# Patient Record
Sex: Female | Born: 1975 | Hispanic: No | Marital: Married
Health system: Southern US, Community
[De-identification: ages and names within clinical notes are randomized; demographics above are authoritative.]

## PROBLEM LIST (undated history)

## (undated) DIAGNOSIS — C801 Malignant (primary) neoplasm, unspecified: Secondary | ICD-10-CM

## (undated) DIAGNOSIS — E039 Hypothyroidism, unspecified: Secondary | ICD-10-CM

## (undated) HISTORY — PX: THYROIDECTOMY: SHX17

---

## 2020-10-29 ENCOUNTER — Emergency Department (HOSPITAL_COMMUNITY): Payer: HRSA Program

## 2020-10-29 ENCOUNTER — Encounter (HOSPITAL_COMMUNITY): Payer: Self-pay

## 2020-10-29 ENCOUNTER — Other Ambulatory Visit: Payer: Self-pay

## 2020-10-29 ENCOUNTER — Emergency Department (HOSPITAL_COMMUNITY)
Admission: EM | Admit: 2020-10-29 | Discharge: 2020-10-29 | Disposition: A | Discharge status: Home / Self Care | Payer: HRSA Program | Attending: Emergency Medicine | Admitting: Emergency Medicine

## 2020-10-29 DIAGNOSIS — E039 Hypothyroidism, unspecified: Secondary | ICD-10-CM | POA: Insufficient documentation

## 2020-10-29 DIAGNOSIS — I9589 Other hypotension: Secondary | ICD-10-CM

## 2020-10-29 DIAGNOSIS — E861 Hypovolemia: Secondary | ICD-10-CM | POA: Diagnosis not present

## 2020-10-29 DIAGNOSIS — R8271 Bacteriuria: Secondary | ICD-10-CM | POA: Diagnosis not present

## 2020-10-29 DIAGNOSIS — U071 COVID-19: Secondary | ICD-10-CM | POA: Insufficient documentation

## 2020-10-29 DIAGNOSIS — I959 Hypotension, unspecified: Secondary | ICD-10-CM | POA: Diagnosis not present

## 2020-10-29 DIAGNOSIS — Z79899 Other long term (current) drug therapy: Secondary | ICD-10-CM | POA: Diagnosis not present

## 2020-10-29 DIAGNOSIS — R531 Weakness: Secondary | ICD-10-CM | POA: Diagnosis present

## 2020-10-29 LAB — URINALYSIS, ROUTINE W REFLEX MICROSCOPIC
Bilirubin Urine: NEGATIVE
Glucose, UA: NEGATIVE mg/dL
Ketones, ur: NEGATIVE mg/dL
Leukocytes,Ua: NEGATIVE
Nitrite: POSITIVE — AB
Protein, ur: NEGATIVE mg/dL
Specific Gravity, Urine: 1.014 (ref 1.005–1.030)
pH: 5 (ref 5.0–8.0)

## 2020-10-29 LAB — COMPREHENSIVE METABOLIC PANEL
ALT: 18 U/L (ref 0–44)
AST: 20 U/L (ref 15–41)
Albumin: 3.9 g/dL (ref 3.5–5.0)
Alkaline Phosphatase: 54 U/L (ref 38–126)
Anion gap: 8 (ref 5–15)
BUN: 12 mg/dL (ref 6–20)
CO2: 25 mmol/L (ref 22–32)
Calcium: 7.4 mg/dL — ABNORMAL LOW (ref 8.9–10.3)
Chloride: 103 mmol/L (ref 98–111)
Creatinine, Ser: 0.98 mg/dL (ref 0.44–1.00)
GFR, Estimated: >60 mL/min (ref >60)
Glucose, Bld: 106 mg/dL — ABNORMAL HIGH (ref 70–99)
Potassium: 4.6 mmol/L (ref 3.5–5.1)
Sodium: 136 mmol/L (ref 135–145)
Total Bilirubin: 0.3 mg/dL (ref 0.3–1.2)
Total Protein: 6.8 g/dL (ref 6.5–8.1)

## 2020-10-29 LAB — CBC WITH DIFFERENTIAL/PLATELET
Abs Immature Granulocytes: 0.04 10*3/uL (ref 0.00–0.07)
Basophils Absolute: 0 10*3/uL (ref 0.0–0.1)
Basophils Relative: 1 %
Eosinophils Absolute: 0.1 10*3/uL (ref 0.0–0.5)
Eosinophils Relative: 1 %
HCT: 42.7 % (ref 36.0–46.0)
Hemoglobin: 14.2 g/dL (ref 12.0–15.0)
Immature Granulocytes: 1 %
Lymphocytes Relative: 11 %
Lymphs Abs: 1 10*3/uL (ref 0.7–4.0)
MCH: 32.6 pg (ref 26.0–34.0)
MCHC: 33.3 g/dL (ref 30.0–36.0)
MCV: 98.2 fL (ref 80.0–100.0)
Monocytes Absolute: 0.8 10*3/uL (ref 0.1–1.0)
Monocytes Relative: 9 %
Neutro Abs: 6.9 10*3/uL (ref 1.7–7.7)
Neutrophils Relative %: 77 %
Platelets: 213 10*3/uL (ref 150–400)
RBC: 4.35 MIL/uL (ref 3.87–5.11)
RDW: 13.9 % (ref 11.5–15.5)
WBC: 8.8 10*3/uL (ref 4.0–10.5)
nRBC: 0 % (ref 0.0–0.2)

## 2020-10-29 LAB — LACTIC ACID, PLASMA: Lactic Acid, Venous: 0.8 mmol/L (ref 0.5–1.9)

## 2020-10-29 LAB — RESP PANEL BY RT-PCR (FLU A&B, COVID) ARPGX2
Influenza A by PCR: NEGATIVE
Influenza B by PCR: NEGATIVE
SARS Coronavirus 2 by RT PCR: POSITIVE — AB

## 2020-10-29 MED ORDER — SODIUM CHLORIDE 0.9 % IV BOLUS (SEPSIS)
1000.0000 mL | Freq: Once | INTRAVENOUS | Status: AC
  Administered 2020-10-29: 1000 mL via INTRAVENOUS

## 2020-10-29 MED ORDER — SODIUM CHLORIDE 0.9 % IV BOLUS
1000.0000 mL | Freq: Once | INTRAVENOUS | Status: AC
  Administered 2020-10-29: 1000 mL via INTRAVENOUS

## 2020-10-29 MED ORDER — SODIUM CHLORIDE 0.9 % IV SOLN
2.0000 g | Freq: Once | INTRAVENOUS | Status: AC
  Administered 2020-10-29: 2 g via INTRAVENOUS
  Filled 2020-10-29: qty 20

## 2020-10-29 MED ORDER — ONDANSETRON HCL 4 MG/2ML IJ SOLN
4.0000 mg | Freq: Once | INTRAMUSCULAR | Status: AC
  Administered 2020-10-29: 4 mg via INTRAVENOUS
  Filled 2020-10-29: qty 2

## 2020-10-29 MED ORDER — KETOROLAC TROMETHAMINE 30 MG/ML IJ SOLN
30.0000 mg | Freq: Once | INTRAMUSCULAR | Status: AC
  Administered 2020-10-29: 30 mg via INTRAVENOUS
  Filled 2020-10-29: qty 1

## 2020-10-29 MED ORDER — LACTATED RINGERS IV BOLUS
1000.0000 mL | Freq: Once | INTRAVENOUS | Status: AC
  Administered 2020-10-29: 1000 mL via INTRAVENOUS

## 2020-10-29 NOTE — ED Provider Notes (Signed)
[facility] EMERGENCY DEPARTMENT Provider Note   CSN: [id number] Arrival date & time: 10/29/20  0602     History Chief Complaint  Patient presents with  . Muscle Pain    Christina Mccoy is a 44 y.o. female.  Patient states that for 2 days she has been having aches runny nose weakness.  The history is provided by the patient and medical records. No language interpreter was used.  Muscle Pain This is a new problem. The current episode started 2 days ago. The problem occurs constantly. The problem has not changed since onset.Pertinent negatives include no chest pain, no abdominal pain and no headaches. Nothing aggravates the symptoms. She has tried nothing for the symptoms.       History reviewed. No pertinent past medical history.  There are no problems to display for this patient.   History reviewed. No pertinent surgical history.   OB History   No obstetric history on file.     No family history on file.     Home Medications Prior to Admission medications   Medication Sig Start Date End Date Taking? Authorizing Provider  ibuprofen (ADVIL) 200 MG tablet Take 800 mg by mouth every 6 (six) hours as needed for fever or headache.   Yes [provider]  levothyroxine (SYNTHROID) 100 MCG tablet Take 100 mcg by mouth daily before breakfast.   Yes [provider]    Allergies    Patient has no known allergies.  Review of Systems   Review of Systems  Constitutional: Positive for fatigue. Negative for appetite change.  HENT: Negative for congestion, ear discharge and sinus pressure.        Nasal discharge  Eyes: Negative for discharge.  Respiratory: Negative for cough.   Cardiovascular: Negative for chest pain.  Gastrointestinal: Negative for abdominal pain and diarrhea.  Genitourinary: Negative for frequency and hematuria.  Musculoskeletal: Negative for back pain.  Skin: Negative for rash.  Neurological: Negative for seizures and headaches.   Psychiatric/Behavioral: Negative for hallucinations.    Physical Exam Updated Vital Signs BP 99/67   Pulse 71   Temp 97.9 F (36.6 C) (Oral)   Resp 19   Ht 5\' 10"  (1.778 m)   Wt 102.7 kg   SpO2 95%   BMI 32.50 kg/m   Physical Exam Vitals and nursing note reviewed.  Constitutional:      Appearance: She is well-developed.  HENT:     Head: Normocephalic.     Nose: Nose normal.  Eyes:     General: No scleral icterus.    Extraocular Movements: EOM normal.     Conjunctiva/sclera: Conjunctivae normal.  Neck:     Thyroid: No thyromegaly.  Cardiovascular:     Rate and Rhythm: Normal rate and regular rhythm.     Heart sounds: No murmur heard. No friction rub. No gallop.   Pulmonary:     Breath sounds: No stridor. No wheezing or rales.  Chest:     Chest wall: No tenderness.  Abdominal:     General: There is no distension.     Tenderness: There is no abdominal tenderness. There is no rebound.  Musculoskeletal:        General: No edema. Normal range of motion.     Cervical back: Neck supple.  Lymphadenopathy:     Cervical: No cervical adenopathy.  Skin:    Findings: No erythema or rash.  Neurological:     Mental Status: She is alert and oriented to person, place, and time.  Motor: No abnormal muscle tone.     Coordination: Coordination normal.  Psychiatric:        Mood and Affect: Mood and affect normal.        Behavior: Behavior normal.     ED Results / Procedures / Treatments   Labs (all labs ordered are listed, but only abnormal results are displayed) Labs Reviewed  RESP PANEL BY RT-PCR (FLU A&B, COVID) ARPGX2 - Abnormal; Notable for the following components:      Result Value   SARS Coronavirus 2 by RT PCR POSITIVE (*)    All other components within normal limits  COMPREHENSIVE METABOLIC PANEL - Abnormal; Notable for the following components:   Glucose, Bld 106 (*)    Calcium 7.4 (*)    All other components within normal limits  URINALYSIS, ROUTINE W  REFLEX MICROSCOPIC - Abnormal; Notable for the following components:   APPearance HAZY (*)    Hgb urine dipstick LARGE (*)    Nitrite POSITIVE (*)    Bacteria, UA MANY (*)    All other components within normal limits  CULTURE, BLOOD (ROUTINE X 2)  CULTURE, BLOOD (ROUTINE X 2)  URINE CULTURE  CBC WITH DIFFERENTIAL/PLATELET  LACTIC ACID, PLASMA    EKG None  Radiology DG Chest Port 1 View  Result Date: 10/29/2020 CLINICAL DATA:  Shortness of breath with headache, fatigue, runny nose, nausea and myosis. Symptoms 2 days. COVID test pending. EXAM: PORTABLE CHEST 1 VIEW COMPARISON:  None. FINDINGS: Lungs are adequately inflated without focal airspace consolidation or effusion. Calcified granuloma over the right lung base. Cardiomediastinal silhouette and remainder of the exam is unremarkable. IMPRESSION: No active disease. Electronically Signed   By: Marin Olp M.D.   On: 10/29/2020 08:20    Procedures Procedures   Medications Ordered in ED Medications  lactated ringers bolus 1,000 mL (has no administration in time range)  sodium chloride 0.9 % bolus 1,000 mL (0 mLs Intravenous Stopped 10/29/20 0950)  ketorolac (TORADOL) 30 MG/ML injection 30 mg (30 mg Intravenous Given 10/29/20 0823)  ondansetron (ZOFRAN) injection 4 mg (4 mg Intravenous Given 10/29/20 0823)  sodium chloride 0.9 % bolus 1,000 mL (0 mLs Intravenous Stopped 10/29/20 1051)  cefTRIAXone (ROCEPHIN) 2 g in sodium chloride 0.9 % 100 mL IVPB (0 g Intravenous Stopped 10/29/20 1205)  sodium chloride 0.9 % bolus 1,000 mL (0 mLs Intravenous Stopped 10/29/20 1205)    ED Course  I have reviewed the triage vital signs and the nursing notes.  Pertinent labs & imaging results that were available during my care of the patient were reviewed by me and considered in my medical decision making (see chart for details).    MDM Rules/Calculators/A&P                          Patient Covid positive and also has bacteriuria.  Patient has  been hypotensive even after 3 L of saline.  She will be admitted by the hospitalist for further hydration Final Clinical Impression(s) / ED Diagnoses Final diagnoses:  COVID-19  Hypotension due to hypovolemia    Rx / DC Orders ED Discharge Orders    None       Milton Ferguson, MD 10/29/20 1305

## 2020-10-29 NOTE — ED Notes (Signed)
Pt leaving AMA per request

## 2020-10-29 NOTE — ED Triage Notes (Signed)
Pt complains of headache, fatigue, runny nose, nausea, muscle pain pain all over x2 days.

## 2020-10-29 NOTE — ED Notes (Signed)
Date and time results received: 10/29/20 & 1039hrs (use smartphrase ".now" to insert current time)  Test: COVID Critical Value: POSITIVE  Name of Provider Notified: Dr. Roderic Palau  Orders Received? Or Actions Taken?: notified

## 2020-10-29 NOTE — ED Notes (Signed)
Dr. Darlyn Chamber of bp

## 2020-10-29 NOTE — Consult Note (Signed)
Christina Mccoy RXV:[id number] DOB: [birth year] DOA: 10/29/2020   PCP: Patient, No Pcp Per   Patient coming from: Home  Chief Complaint: myalgia, general weakness  HPI:  Christina Mccoy is a 45 y.o. female with medical history of thyroid cancer status post thyroidectomy with resultant hypothyroidism presenting with 2-day history of generalized weakness, fatigue, myalgias, sore throat, and rhinorrhea.  The patient had denied any fevers, chills, chest pain, shortness of breath, headache, vomiting,  abdominal pain, dysuria.  She states that she has had some intermittent loose stools for the past 4 to 5 days, but denies any hematochezia or melena.  She denies any abdominal cramping or pain.  She denies any recent travels or sick contacts.  The patient is a Programme researcher, broadcasting/film/video at [employer]. In the emergency department, the patient was afebrile, but was mildly hypertensive with systolic blood pressure in the 80s.  Patient was given 3 L of normal saline, but her blood pressures continue to remain soft in the low 90s.  As a result, admission was requested for further evaluation and treatment.  Oxygen saturation was 97-99% on room air.  Chest x-ray did not show any infiltrates.  BMP shows sodium 136, potassium 4.6, serum creatinine 0.98.  BUN was 12.  Hepatic panel was unremarkable.  WBC 8.8, hemoglobin 14.2, platelets 213,000.  Urinalysis was negative for pyuria.  Ceftriaxone was given empirically by EDP. I discussed the patient about observation admission to ensure her clinical stability given her hypotension and continued soft blood pressures.  The patient was adamant about leaving [place].  In speaking with Enid Skeens, Jaxon Mynhier has demonstrated the ability to understand his medical condition(s) which include COVID-19 infection and hypotension.  Roxie Kreeger has demonstrated the ability to appreciate how treatment for COVID-19 infection and hypotension  will be beneficial.   Aurielle Slingerland has also  demonstrated the ability to understand and appreciate how refusal of treatement for  COVID-19 infection and hypotension could result in harm, repeat hospitalization, and possibly death.  Wilmina Maxham demonstrates the ability to reason through the risks and benefits of the proposed treatment.  Finally, Denyce Harr is able to clearly communicate his/her choice.     Assessment/Plan: COVID-19 infection -Patient is stable on room air -patient left AMA prior to obtaining inflammatory markers -CXR--no edema or infiltrates  Hypotension -Likely due to volume depletion as the patient has normal lactic acid and is afebrile -Follow blood cultures -Check PCT  Tobacco abuse -Tobacco cessation discussed  Hypothyroidism -Continue Synthroid       History reviewed. No pertinent past medical history. History reviewed. No pertinent surgical history. Social History: 30 pack year hx.  No alcohol or street drugs  Family hx--reviewed.  No pertinent family hx  No Known Allergies   Prior to Admission medications   Medication Sig Start Date End Date Taking? Authorizing Provider  ibuprofen (ADVIL) 200 MG tablet Take 800 mg by mouth every 6 (six) hours as needed for fever or headache.   Yes [provider]  levothyroxine (SYNTHROID) 100 MCG tablet Take 100 mcg by mouth daily before breakfast.   Yes [provider]    Review of Systems:  Constitutional:  No weight loss, night sweats,  Head&Eyes: No headache.  No vision loss.  No eye pain or scotoma ENT:  No Difficulty swallowing,Tooth/dental problems,Sore throat,  No ear ache, post nasal drip,  Cardio-vascular:  No chest pain, Orthopnea, PND, swelling in lower extremities,  dizziness, palpitations  GI:  No  abdominal pain, nausea, vomiting, loss of appetite, hematochezia, melena, heartburn, indigestion, Resp:  No shortness of breath with exertion or at rest. No coughing up of blood .No wheezing.No chest wall deformity   Skin:  no rash or lesions.  GU:  no dysuria, change in color of urine, no urgency or frequency. No flank pain.  Musculoskeletal:  No joint pain or swelling. No decreased range of motion. No back pain.  Psych:  No change in mood or affect. No depression or anxiety. Neurologic: No headache, no dysesthesia, no focal weakness, no vision loss. No syncope  Physical Exam: Vitals:   10/29/20 1130 10/29/20 1200 10/29/20 1215 10/29/20 1300  BP: 102/90 99/67 97/74  92/67  Pulse: 73 71 79 72  Resp: 17 19    Temp:      TempSrc:      SpO2: 95% 95% 99% 97%  Weight:      Height:       General:  A&O x 3, NAD, nontoxic, pleasant/cooperative Head/Eye: No conjunctival hemorrhage, no icterus, [place]/AT, No nystagmus ENT:  No icterus,  No thrush, good dentition, no pharyngeal exudate Neck:  No masses, no lymphadenpathy, no bruits CV:  RRR, no rub, no gallop, no S3 Lung:  Diminished BS, but CTAB, good air movement, no wheeze, no rhonchi Abdomen: soft/NT, +BS, nondistended, no peritoneal signs Ext: No cyanosis, No rashes, No petechiae, No lymphangitis, No edema Neuro: CNII-XII intact, strength 4/5 in bilateral upper and lower extremities, no dysmetria  Labs on Admission:  Basic Metabolic Panel: Recent Labs  Lab 10/29/20 0808  NA 136  K 4.6  CL 103  CO2 25  GLUCOSE 106*  BUN 12  CREATININE 0.98  CALCIUM 7.4*   Liver Function Tests: Recent Labs  Lab 10/29/20 0808  AST 20  ALT 18  ALKPHOS 54  BILITOT 0.3  PROT 6.8  ALBUMIN 3.9   No results for input(s): LIPASE, AMYLASE in the last 168 hours. No results for input(s): AMMONIA in the last 168 hours. CBC: Recent Labs  Lab 10/29/20 0808  WBC 8.8  NEUTROABS 6.9  HGB 14.2  HCT 42.7  MCV 98.2  PLT 213   Coagulation Profile: No results for input(s): INR, PROTIME in the last 168 hours. Cardiac Enzymes: No results for input(s): CKTOTAL, CKMB, CKMBINDEX, TROPONINI in the last 168 hours. BNP: Invalid input(s): POCBNP CBG: No  results for input(s): GLUCAP in the last 168 hours. Urine analysis:    Component Value Date/Time   COLORURINE YELLOW 10/29/2020 0933   APPEARANCEUR HAZY (A) 10/29/2020 0933   LABSPEC 1.014 10/29/2020 0933   PHURINE 5.0 10/29/2020 0933   GLUCOSEU NEGATIVE 10/29/2020 0933   HGBUR LARGE (A) 10/29/2020 0933   BILIRUBINUR NEGATIVE 10/29/2020 0933   KETONESUR NEGATIVE 10/29/2020 0933   PROTEINUR NEGATIVE 10/29/2020 0933   NITRITE POSITIVE (A) 10/29/2020 0933   LEUKOCYTESUR NEGATIVE 10/29/2020 0933   Sepsis Labs: @LABRCNTIP (procalcitonin:4,lacticidven:4) ) Recent Results (from the past 240 hour(s))  Resp Panel by RT-PCR (Flu A&B, Covid) Nasopharyngeal Swab     Status: Abnormal   Collection Time: 10/29/20  8:13 AM   Specimen: Nasopharyngeal Swab; Nasopharyngeal(NP) swabs in vial transport medium  Result Value Ref Range Status   SARS Coronavirus 2 by RT PCR POSITIVE (A) NEGATIVE Final    Comment: RESULT CALLED TO, READ BACK BY AND VERIFIED WITH: LONG L @ 1040 ON [id number] BY HENDERSON L (NOTE) SARS-CoV-2 target nucleic acids are DETECTED.  The SARS-CoV-2 RNA is generally detectable in upper respiratory specimens during  the acute phase of infection. Positive results are indicative of the presence of the identified virus, but do not rule out bacterial infection or co-infection with other pathogens not detected by the test. Clinical correlation with patient history and other diagnostic information is necessary to determine patient infection status. The expected result is Negative.  Fact Sheet for Patients: EntrepreneurPulse.com.au  Fact Sheet for Healthcare Providers: IncredibleEmployment.be  This test is not yet approved or cleared by the [place] FDA and  has been authorized for detection and/or diagnosis of SARS-CoV-2 by FDA under an Emergency Use Authorization (EUA).  This EUA will remain in effect (meaning this test c an be used) for the  duration of  the COVID-19 declaration under Section 564(b)(1) of the Act, 21 U.S.C. section 360bbb-3(b)(1), unless the authorization is terminated or revoked sooner.     Influenza A by PCR NEGATIVE NEGATIVE Final   Influenza B by PCR NEGATIVE NEGATIVE Final    Comment: (NOTE) The Xpert Xpress SARS-CoV-2/FLU/RSV plus assay is intended as an aid in the diagnosis of influenza from Nasopharyngeal swab specimens and should not be used as a sole basis for treatment. Nasal washings and aspirates are unacceptable for Xpert Xpress SARS-CoV-2/FLU/RSV testing.  Fact Sheet for Patients: EntrepreneurPulse.com.au  Fact Sheet for Healthcare Providers: IncredibleEmployment.be  This test is not yet approved or cleared by the [place] FDA and has been authorized for detection and/or diagnosis of SARS-CoV-2 by FDA under an Emergency Use Authorization (EUA). This EUA will remain in effect (meaning this test can be used) for the duration of the COVID-19 declaration under Section 564(b)(1) of the Act, 21 U.S.C. section 360bbb-3(b)(1), unless the authorization is terminated or revoked.  Performed at [facility], [address] [place], [place] [address]   Blood culture (routine x 2)     Status: None (Preliminary result)   Collection Time: 10/29/20 10:35 AM   Specimen: BLOOD RIGHT HAND  Result Value Ref Range Status   Specimen Description   Final    BLOOD RIGHT HAND BOTTLES DRAWN AEROBIC AND ANAEROBIC   Special Requests   Final    Blood Culture adequate volume Performed at [facility], [address], [place], [place] [address]    Culture PENDING  Incomplete   Report Status PENDING  Incomplete  Blood culture (routine x 2)     Status: None (Preliminary result)   Collection Time: 10/29/20 10:35 AM   Specimen: BLOOD RIGHT ARM  Result Value Ref Range Status   Specimen Description   Final    BLOOD RIGHT ARM BOTTLES DRAWN AEROBIC AND ANAEROBIC   Special  Requests   Final    Blood Culture adequate volume Performed at [facility], [address] [place], [place] [address]    Culture PENDING  Incomplete   Report Status PENDING  Incomplete     Radiological Exams on Admission: DG Chest Port 1 View  Result Date: 10/29/2020 CLINICAL DATA:  Shortness of breath with headache, fatigue, runny nose, nausea and myosis. Symptoms 2 days. COVID test pending. EXAM: PORTABLE CHEST 1 VIEW COMPARISON:  None. FINDINGS: Lungs are adequately inflated without focal airspace consolidation or effusion. Calcified granuloma over the right lung base. Cardiomediastinal silhouette and remainder of the exam is unremarkable. IMPRESSION: No active disease. Electronically Signed   By: Marin Olp M.D.   On: 10/29/2020 08:20        Time spent:60 minutes Code Status:   FULL Family Communication:  No Family at bedside Disposition Plan:Leaving AMA Consults called:  none DVT Prophylaxis: Leaving AMA  Orson Eva, DO  [facility] Pager [phone]  If 7PM-7AM, please contact night-coverage [email or web address] Password [email or web address] 10/29/2020, 1:29 PM

## 2020-10-30 ENCOUNTER — Telehealth: Payer: Self-pay | Admitting: Infectious Diseases

## 2020-10-30 NOTE — Telephone Encounter (Signed)
Called to discuss with patient about COVID-19 symptoms and the use of one of the available treatments for those with mild to moderate Covid symptoms and at a high risk of hospitalization.  Pt appears to qualify for outpatient treatment due to co-morbid conditions and/or a member of an at-risk group in accordance with the FDA Emergency Use Authorization.    Symptom onset: 2/25 Vaccinated: not documented  Booster?  Immunocompromised? No evidence in medical history  Qualifiers: BMI  Unable to reach pt - voicemail is full.   Patient was seen in ER yesterday and recommended hospitalization for dehydration. She refused all treatments related to COVID infection at that time as well.    Christina Mccoy

## 2020-11-03 LAB — CULTURE, BLOOD (ROUTINE X 2)
Culture: NO GROWTH
Culture: NO GROWTH
Special Requests: ADEQUATE
Special Requests: ADEQUATE

## 2021-03-14 ENCOUNTER — Emergency Department (HOSPITAL_COMMUNITY): Payer: Self-pay

## 2021-03-14 ENCOUNTER — Encounter (HOSPITAL_COMMUNITY): Payer: Self-pay | Admitting: Emergency Medicine

## 2021-03-14 ENCOUNTER — Other Ambulatory Visit: Payer: Self-pay

## 2021-03-14 ENCOUNTER — Emergency Department (HOSPITAL_COMMUNITY)
Admission: EM | Admit: 2021-03-14 | Discharge: 2021-03-14 | Disposition: A | Discharge status: Home / Self Care | Payer: Self-pay | Attending: Emergency Medicine | Admitting: Emergency Medicine

## 2021-03-14 DIAGNOSIS — R42 Dizziness and giddiness: Secondary | ICD-10-CM | POA: Insufficient documentation

## 2021-03-14 DIAGNOSIS — Z79899 Other long term (current) drug therapy: Secondary | ICD-10-CM | POA: Insufficient documentation

## 2021-03-14 DIAGNOSIS — R059 Cough, unspecified: Secondary | ICD-10-CM | POA: Insufficient documentation

## 2021-03-14 DIAGNOSIS — R5383 Other fatigue: Secondary | ICD-10-CM | POA: Insufficient documentation

## 2021-03-14 DIAGNOSIS — R519 Headache, unspecified: Secondary | ICD-10-CM | POA: Insufficient documentation

## 2021-03-14 DIAGNOSIS — R410 Disorientation, unspecified: Secondary | ICD-10-CM | POA: Insufficient documentation

## 2021-03-14 DIAGNOSIS — R197 Diarrhea, unspecified: Secondary | ICD-10-CM | POA: Insufficient documentation

## 2021-03-14 DIAGNOSIS — Z8616 Personal history of COVID-19: Secondary | ICD-10-CM | POA: Insufficient documentation

## 2021-03-14 DIAGNOSIS — Z85850 Personal history of malignant neoplasm of thyroid: Secondary | ICD-10-CM | POA: Insufficient documentation

## 2021-03-14 DIAGNOSIS — R220 Localized swelling, mass and lump, head: Secondary | ICD-10-CM | POA: Insufficient documentation

## 2021-03-14 DIAGNOSIS — R0602 Shortness of breath: Secondary | ICD-10-CM | POA: Insufficient documentation

## 2021-03-14 DIAGNOSIS — E039 Hypothyroidism, unspecified: Secondary | ICD-10-CM | POA: Insufficient documentation

## 2021-03-14 HISTORY — DX: Hypothyroidism, unspecified: E03.9

## 2021-03-14 HISTORY — DX: Hypocalcemia: E83.51

## 2021-03-14 HISTORY — DX: Malignant (primary) neoplasm, unspecified: C80.1

## 2021-03-14 LAB — COMPREHENSIVE METABOLIC PANEL
ALT: 18 U/L (ref 0–44)
AST: 19 U/L (ref 15–41)
Albumin: 4.6 g/dL (ref 3.5–5.0)
Alkaline Phosphatase: 59 U/L (ref 38–126)
Anion gap: 9 (ref 5–15)
BUN: 14 mg/dL (ref 6–20)
CO2: 24 mmol/L (ref 22–32)
Calcium: 8.3 mg/dL — ABNORMAL LOW (ref 8.9–10.3)
Chloride: 103 mmol/L (ref 98–111)
Creatinine, Ser: 0.99 mg/dL (ref 0.44–1.00)
GFR, Estimated: >60 mL/min (ref >60)
Glucose, Bld: 95 mg/dL (ref 70–99)
Potassium: 3.8 mmol/L (ref 3.5–5.1)
Sodium: 136 mmol/L (ref 135–145)
Total Bilirubin: 1 mg/dL (ref 0.3–1.2)
Total Protein: 7.4 g/dL (ref 6.5–8.1)

## 2021-03-14 LAB — URINALYSIS, ROUTINE W REFLEX MICROSCOPIC
Bacteria, UA: NONE SEEN
Bilirubin Urine: NEGATIVE
Glucose, UA: NEGATIVE mg/dL
Ketones, ur: 5 mg/dL — AB
Nitrite: NEGATIVE
Protein, ur: NEGATIVE mg/dL
Specific Gravity, Urine: 1.006 (ref 1.005–1.030)
pH: 7 (ref 5.0–8.0)

## 2021-03-14 LAB — CBC WITH DIFFERENTIAL/PLATELET
Abs Immature Granulocytes: 0.08 10*3/uL — ABNORMAL HIGH (ref 0.00–0.07)
Basophils Absolute: 0.1 10*3/uL (ref 0.0–0.1)
Basophils Relative: 1 %
Eosinophils Absolute: 0.2 10*3/uL (ref 0.0–0.5)
Eosinophils Relative: 1 %
HCT: 44.1 % (ref 36.0–46.0)
Hemoglobin: 15.1 g/dL — ABNORMAL HIGH (ref 12.0–15.0)
Immature Granulocytes: 1 %
Lymphocytes Relative: 22 %
Lymphs Abs: 3.4 10*3/uL (ref 0.7–4.0)
MCH: 33 pg (ref 26.0–34.0)
MCHC: 34.2 g/dL (ref 30.0–36.0)
MCV: 96.3 fL (ref 80.0–100.0)
Monocytes Absolute: 0.6 10*3/uL (ref 0.1–1.0)
Monocytes Relative: 4 %
Neutro Abs: 11 10*3/uL — ABNORMAL HIGH (ref 1.7–7.7)
Neutrophils Relative %: 71 %
Platelets: 285 10*3/uL (ref 150–400)
RBC: 4.58 MIL/uL (ref 3.87–5.11)
RDW: 14.6 % (ref 11.5–15.5)
WBC: 15.4 10*3/uL — ABNORMAL HIGH (ref 4.0–10.5)
nRBC: 0 % (ref 0.0–0.2)

## 2021-03-14 LAB — MAGNESIUM: Magnesium: 2.2 mg/dL (ref 1.7–2.4)

## 2021-03-14 LAB — TROPONIN I (HIGH SENSITIVITY): Troponin I (High Sensitivity): <2 ng/L (ref <18)

## 2021-03-14 LAB — PREGNANCY, URINE: Preg Test, Ur: NEGATIVE

## 2021-03-14 MED ORDER — SODIUM CHLORIDE 0.9 % IV BOLUS
1000.0000 mL | Freq: Once | INTRAVENOUS | Status: AC
  Administered 2021-03-14: 1000 mL via INTRAVENOUS

## 2021-03-14 NOTE — ED Triage Notes (Signed)
Pt c/o dizziness, fatigue, and diarrhea for the past week. Pt states she has been out of her medications for hypothyroid and hypocalcemia for a while but finally got them filled 2 days ago.

## 2021-03-14 NOTE — ED Notes (Signed)
Patient to CT at this time

## 2021-03-14 NOTE — ED Provider Notes (Signed)
[facility] EMERGENCY DEPARTMENT Provider Note   CSN: [id number] Arrival date & time: 03/14/21  1159     History Chief Complaint  Patient presents with   Dizziness    Christina Mccoy is a 45 y.o. female.  HPI 45 year old female presents with multiple complaints.  For the past week or 2 she has been having fatigue and dizziness.  She has been out of her hypothyroid meds and has not been taking her calcium supplements for about a month but just got them refilled a few days ago.  Over the last few days she is also been getting short of breath on exertion and at rest.  Some mild cough.  She feels like her face is a little swollen.  No vomiting but did have a little bit of diarrhea over the last couple days.  For the last 2-3 days she is also been feeling confused.  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  Mild headache.  Past Medical History:  Diagnosis Date   Cancer ([place])    thyroid   Hypocalcemia    Hypothyroid     Patient Active Problem List   Diagnosis Date Noted   COVID-19    Hypotension due to hypovolemia     Past Surgical History:  Procedure Laterality Date   THYROIDECTOMY       OB History   No obstetric history on file.     No family history on file.     Home Medications Prior to Admission medications   Medication Sig Start Date End Date Taking? Authorizing Provider  ibuprofen (ADVIL) 200 MG tablet Take 800 mg by mouth every 6 (six) hours as needed for fever or headache.    [provider]  levothyroxine (SYNTHROID) 100 MCG tablet Take 100 mcg by mouth daily before breakfast.    [provider]    Allergies    Patient has no known allergies.  Review of Systems   Review of Systems  Constitutional:  Positive for fatigue. Negative for fever.  HENT:  Positive for facial swelling.   Respiratory:  Positive for shortness of breath.    Cardiovascular:  Negative for chest pain.  Gastrointestinal:  Positive for diarrhea. Negative for abdominal pain and vomiting.  Neurological:  Positive for dizziness and headaches. Negative for weakness and numbness.  Psychiatric/Behavioral:  Positive for confusion.   All other systems reviewed and are negative.  Physical Exam Updated Vital Signs BP 136/86   Pulse 75   Temp 98.2 F (36.8 C) (Oral)   Resp (!) 26   Ht 5\' 10"  (1.778 m)   Wt 99.8 kg   SpO2 96%   BMI 31.57 kg/m   Physical Exam Vitals and nursing note reviewed.  Constitutional:      General: She is not in acute distress.    Appearance: She is well-developed. She is not ill-appearing or diaphoretic.  HENT:     Head: Normocephalic and atraumatic.     Right Ear: External ear normal.     Left Ear: External ear normal.     Nose: Nose normal.  Eyes:     General:        Right eye: No discharge.        Left eye: No discharge.     Extraocular Movements: Extraocular movements intact.     Pupils: Pupils are equal, round, and reactive to light.  Cardiovascular:     Rate and Rhythm: Normal rate and regular rhythm.     Heart sounds: Normal heart sounds.  Pulmonary:     Effort: Pulmonary effort is normal.     Breath sounds: Normal breath sounds.  Abdominal:     Palpations: Abdomen is soft.     Tenderness: There is no abdominal tenderness.  Skin:    General: Skin is warm and dry.  Neurological:     Mental Status: She is alert and oriented to person, place, and time.     Comments: CN 3-12 grossly intact. 5/5 strength in all 4 extremities. Grossly normal sensation. Normal finger to nose.   Psychiatric:        Mood and Affect: Mood is not anxious.    ED Results / Procedures / Treatments   Labs (all labs ordered are listed, but only abnormal results are displayed) Labs Reviewed  COMPREHENSIVE METABOLIC PANEL - Abnormal; Notable for the following components:      Result Value   Calcium 8.3 (*)    All other  components within normal limits  CBC WITH DIFFERENTIAL/PLATELET - Abnormal; Notable for the following components:   WBC 15.4 (*)    Hemoglobin 15.1 (*)    Neutro Abs 11.0 (*)    Abs Immature Granulocytes 0.08 (*)    All other components within normal limits  URINALYSIS, ROUTINE W REFLEX MICROSCOPIC - Abnormal; Notable for the following components:   Hgb urine dipstick MODERATE (*)    Ketones, ur 5 (*)    Leukocytes,Ua SMALL (*)    All other components within normal limits  PREGNANCY, URINE  MAGNESIUM  TROPONIN I (HIGH SENSITIVITY)    EKG EKG Interpretation  Date/Time:  Tuesday March 14 2021 12:29:31 EDT Ventricular Rate:  81 PR Interval:  156 QRS Duration: 105 QT Interval:  391 QTC Calculation: 454 R Axis:   66 Text Interpretation: Sinus rhythm No acute changes No old tracing to compare Confirmed by Sherwood Gambler [phone]) on 03/14/2021 12:49:11 PM  Radiology DG Chest 2 View  Result Date: 03/14/2021 CLINICAL DATA:  Dizziness, fatigue EXAM: CHEST - 2 VIEW COMPARISON:  10/29/2020 FINDINGS: Calcified granuloma at the right lung base. Lungs otherwise clear. Heart is normal size. No effusions or acute bony abnormality. IMPRESSION: No active cardiopulmonary disease. Electronically Signed   By: Rolm Baptise M.D.   On: 03/14/2021 14:38   CT Head Wo Contrast  Result Date: 03/14/2021 CLINICAL DATA:  Mental status changes EXAM: CT HEAD WITHOUT CONTRAST TECHNIQUE: Contiguous axial images were obtained from the base of the skull through the vertex without intravenous contrast. COMPARISON:  None. FINDINGS: Brain: No acute intracranial abnormality. Specifically, no hemorrhage, hydrocephalus, mass lesion, acute infarction, or significant intracranial injury. Vascular: No hyperdense vessel or unexpected calcification. Skull: No acute calvarial abnormality. Sinuses/Orbits: No acute findings Other: None IMPRESSION: Normal study. Electronically Signed   By: Rolm Baptise M.D.   On: 03/14/2021 14:37     Procedures Procedures   Medications Ordered in ED Medications  sodium chloride 0.9 % bolus 1,000 mL (0 mLs Intravenous Stopped 03/14/21 1433)    ED Course  I have reviewed the triage vital signs and the nursing notes.  Pertinent labs & imaging results that were available during my care of the patient were reviewed by me and considered in my medical decision making (see chart for details).    MDM Rules/Calculators/A&P  No objective findings on work-up or exam.  Patient endorses some periorbital swelling first thing in the morning on further talking to her about facial swelling but no other swelling.  No positional edema.  My suspicion of an SVC syndrome is pretty low.  Lungs are clear and chest x-ray is normal.  No increased work of breathing on my exam and her heart rate has been normal the whole time with no hypoxia.  Low suspicion for PE and she is PERC negative.  As far as her "confusion" she is not altered here with a benign neuro exam.  She can get an outpatient neuro follow-up but no indication of needing emergent MRI or other acute treatment/investigation.  Will discharge with return precautions.  She has a mildly low calcium but can continue calcium supplements at home. Final Clinical Impression(s) / ED Diagnoses Final diagnoses:  Dizziness    Rx / DC Orders ED Discharge Orders     None        Sherwood Gambler, MD 03/14/21 1544

## 2022-12-27 IMAGING — CT CT HEAD W/O CM
3 series · 16 of 47 positions shown, 19 images · non-contrast
Comparison: None.

CLINICAL DATA: Mental status changes

EXAM:
CT HEAD WITHOUT CONTRAST
TECHNIQUE: Contiguous axial images were obtained from the base of the skull
through the vertex without intravenous contrast.

[Series 2: head w o · axial · 0.41mm/px · z∈[-37,+103]mm · 10 of 34 slices shown, 13 images]
[im 3/34  brain]
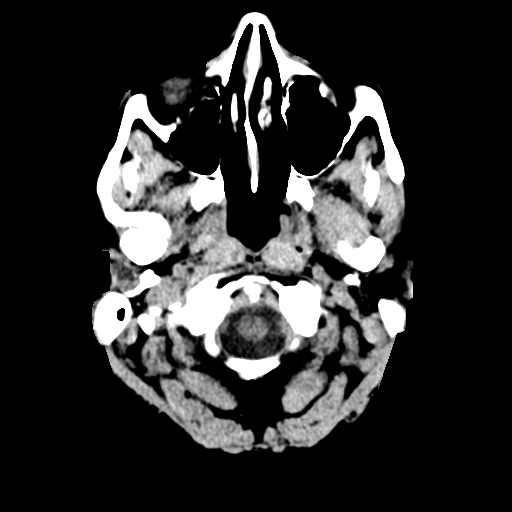
[im 3/34  bone]
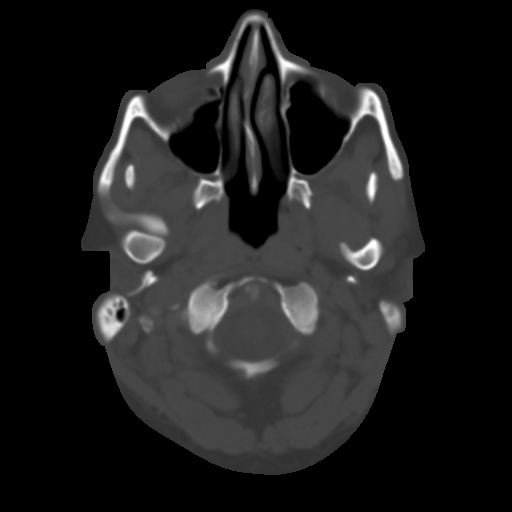
[im 6/34  brain]
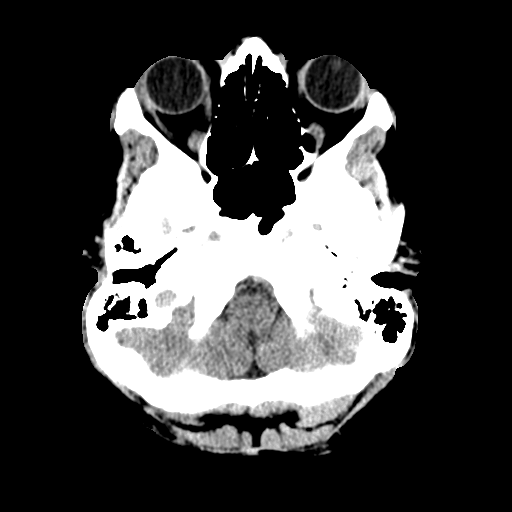
[im 10/34  brain]
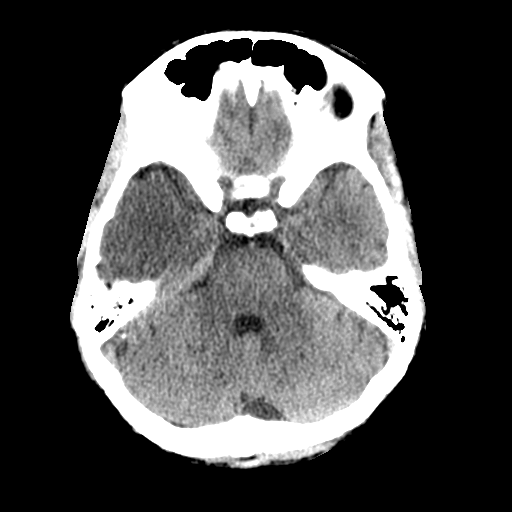
[im 12/34  brain]
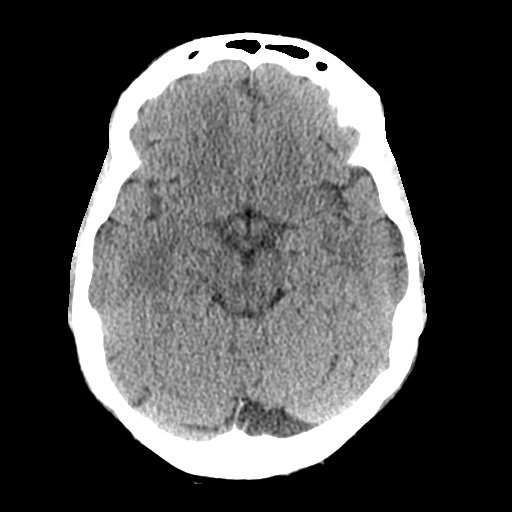
[im 15/34  brain]
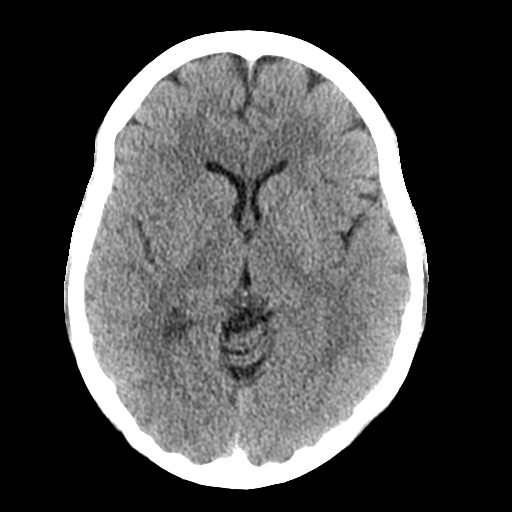
[im 15/34  bone]
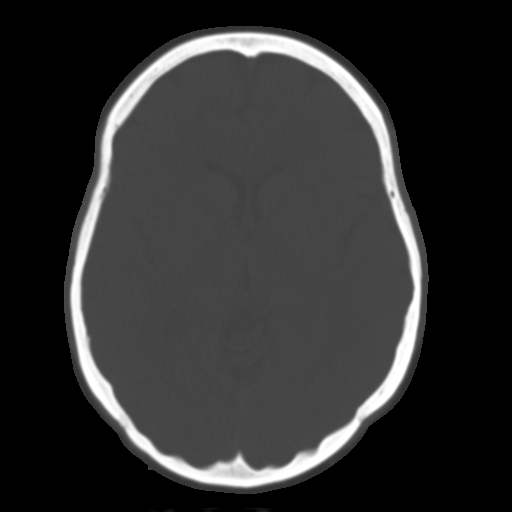
[im 19/34  brain]
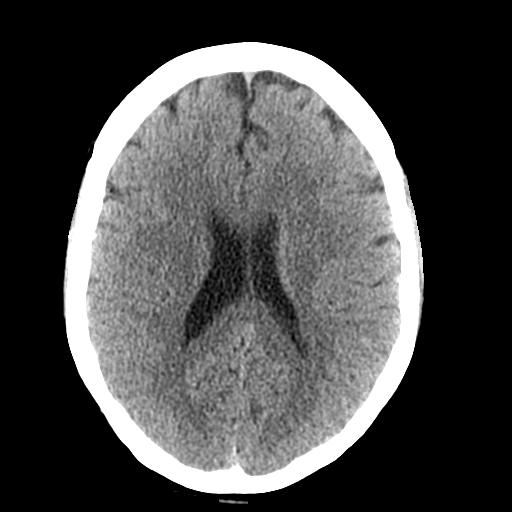
[im 22/34  brain]
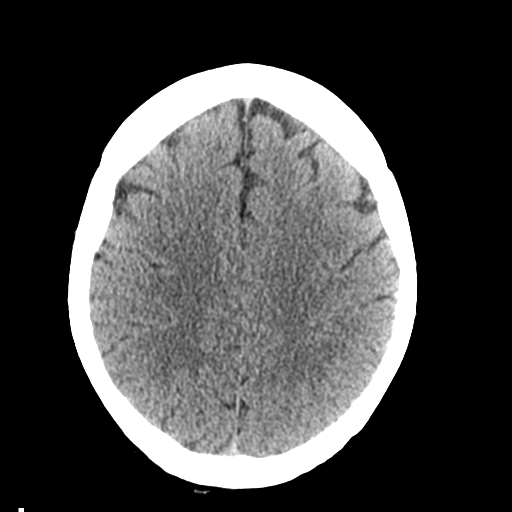
[im 26/34  brain]
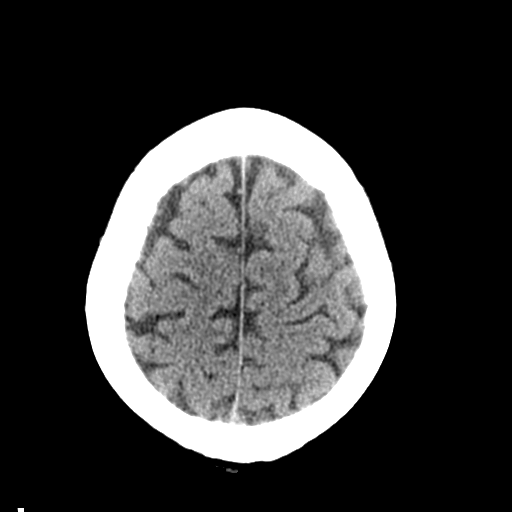
[im 28/34  brain]
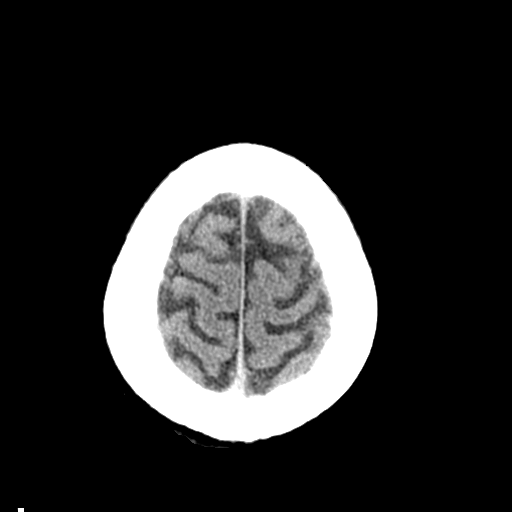
[im 28/34  bone]
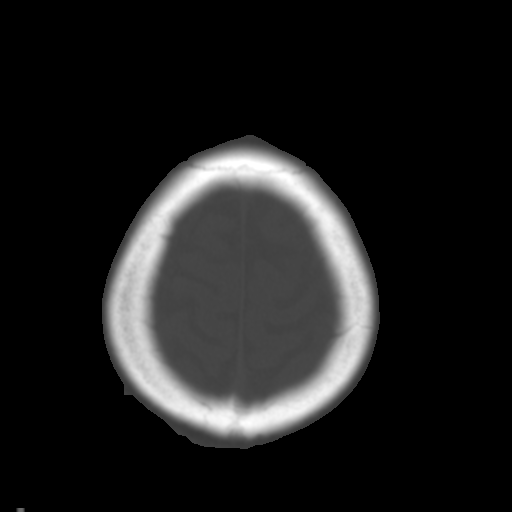
[im 31/34  brain]
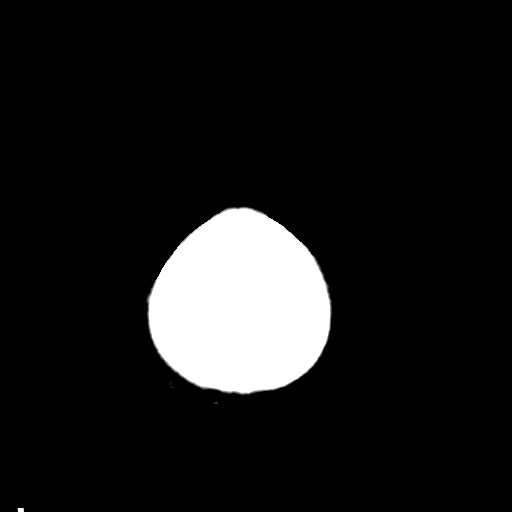

[Series 4: coronal soft · coronal · 0.34mm/px · 3 of 79 slices shown]
[im 27/79  brain]
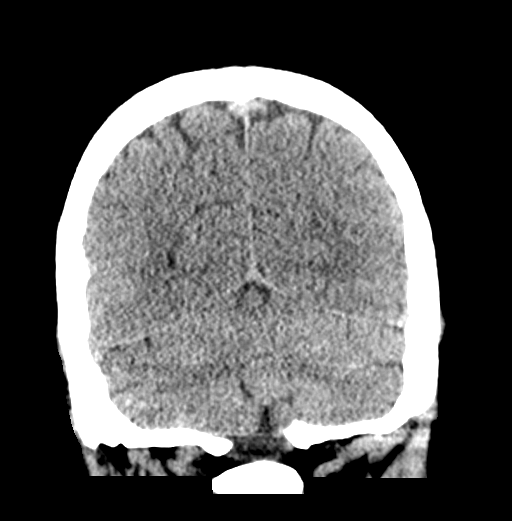
[im 35/79  brain]
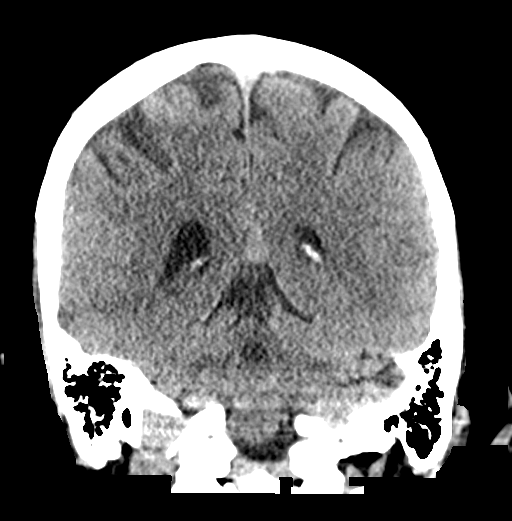
[im 44/79  brain]
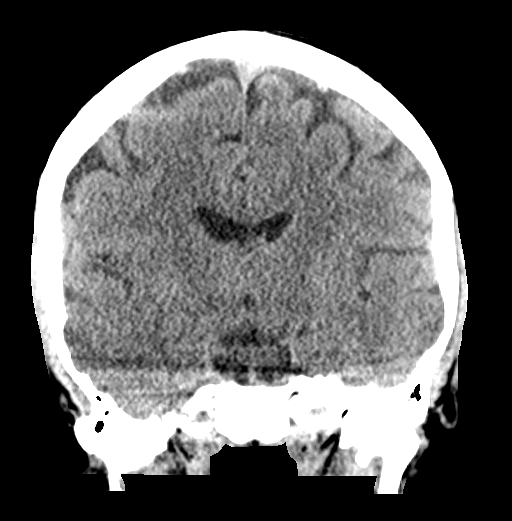

[Series 5: sagittal soft · sagittal · 0.35mm/px · 3 of 65 slices shown]
[im 22/65  brain]
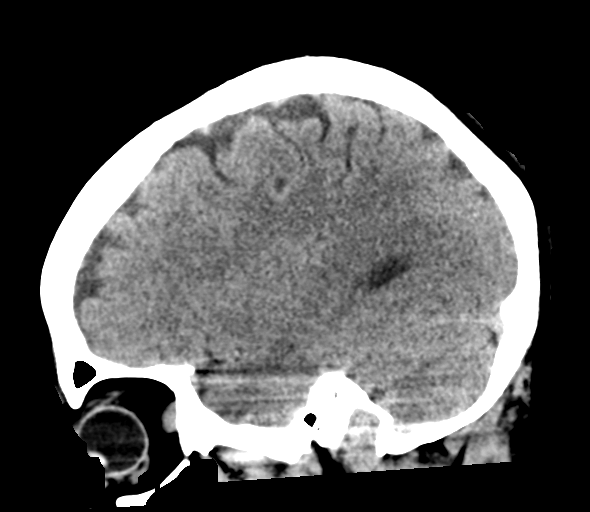
[im 33/65  brain]
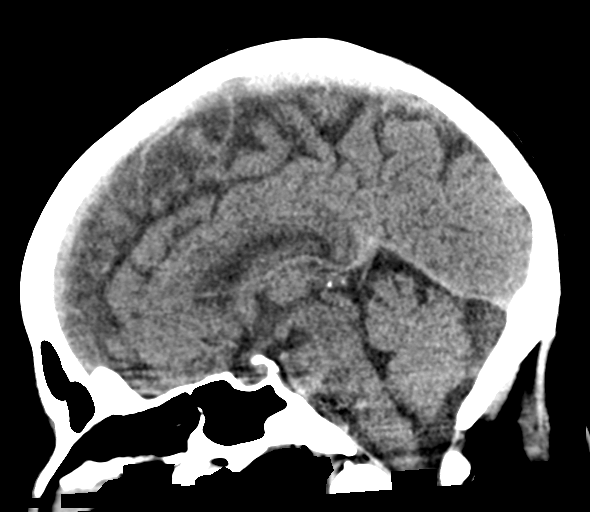
[im 43/65  brain]
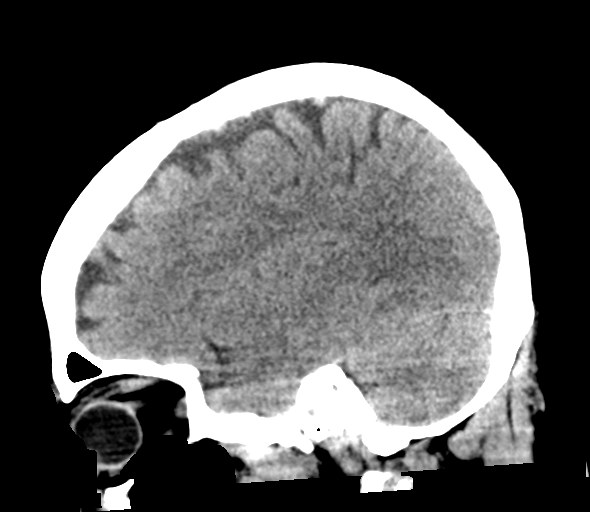

[16 of 47 positions shown; findings below may reference images not displayed]

FINDINGS: Brain: No acute intracranial abnormality. Specifically, no
hemorrhage, hydrocephalus, mass lesion, acute infarction, or
significant intracranial injury.

Vascular: No hyperdense vessel or unexpected calcification.

Skull: No acute calvarial abnormality.

Sinuses/Orbits: No acute findings

Other: None
IMPRESSION: Normal study.

## 2022-12-27 IMAGING — DX DG CHEST 2V
3 series · 3 of 3 positions shown · non-contrast
Comparison: 10/29/2020

CLINICAL DATA: Dizziness, fatigue

EXAM:
CHEST - 2 VIEW

[chest lat (1 of 2)]
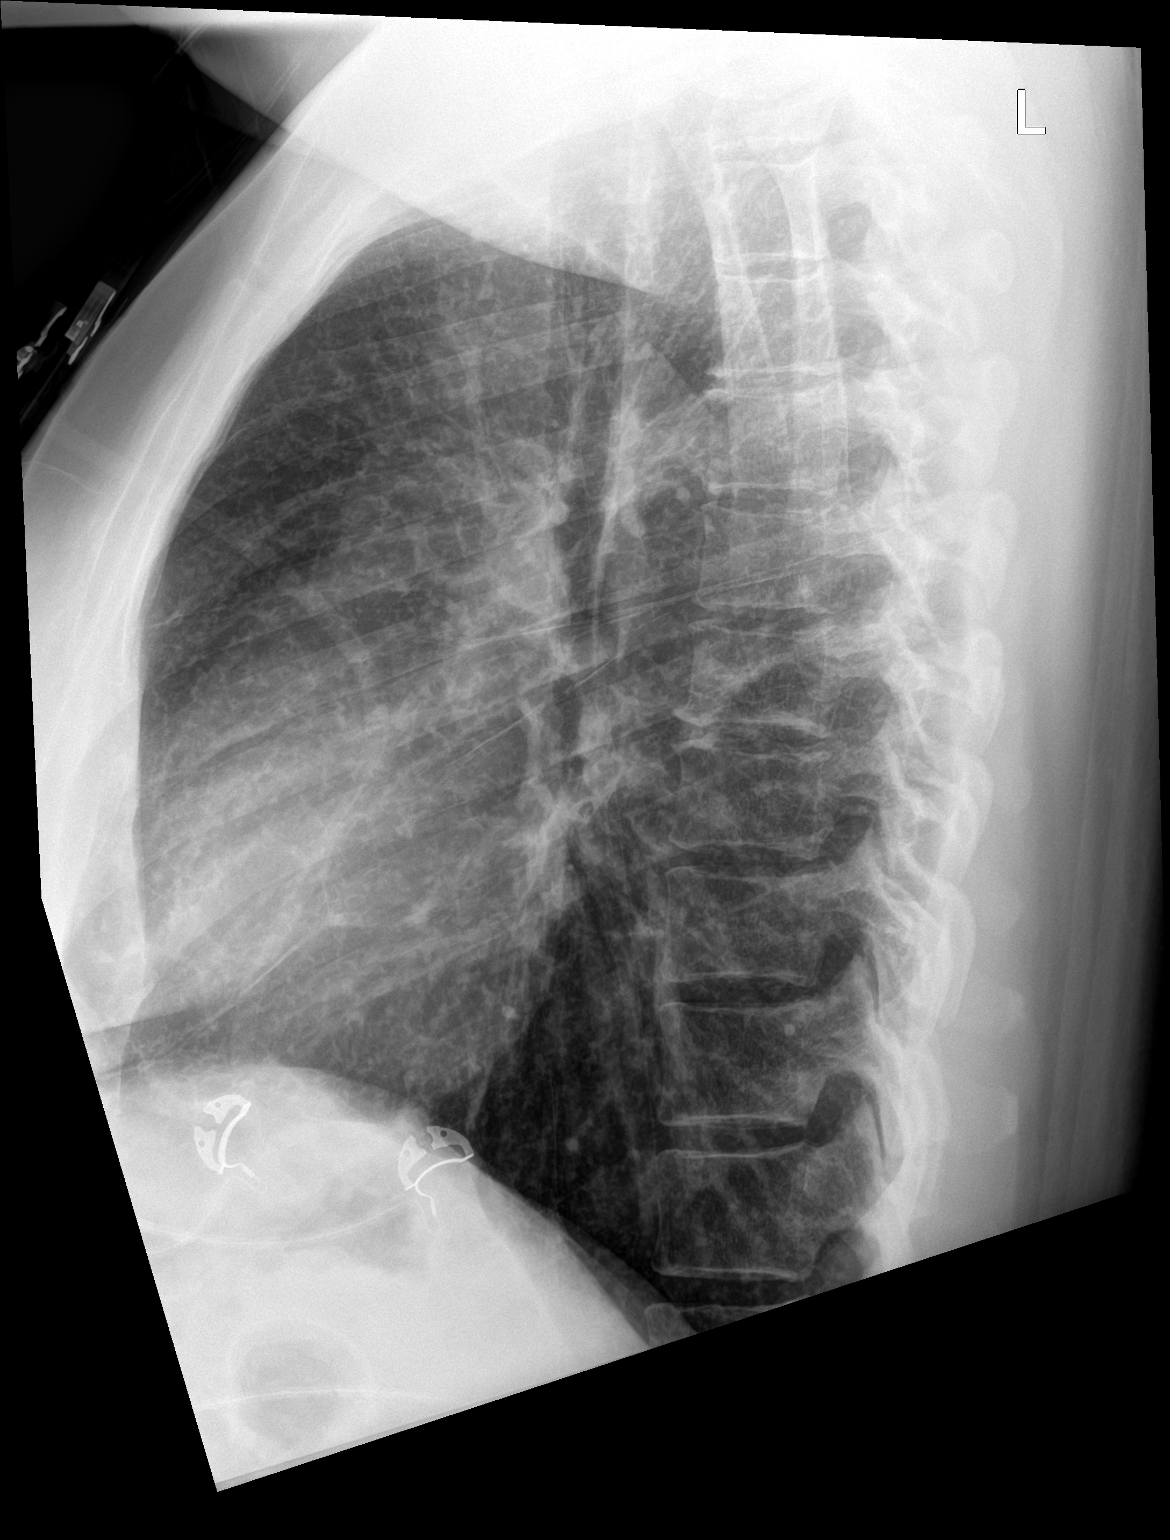

[chest ap]
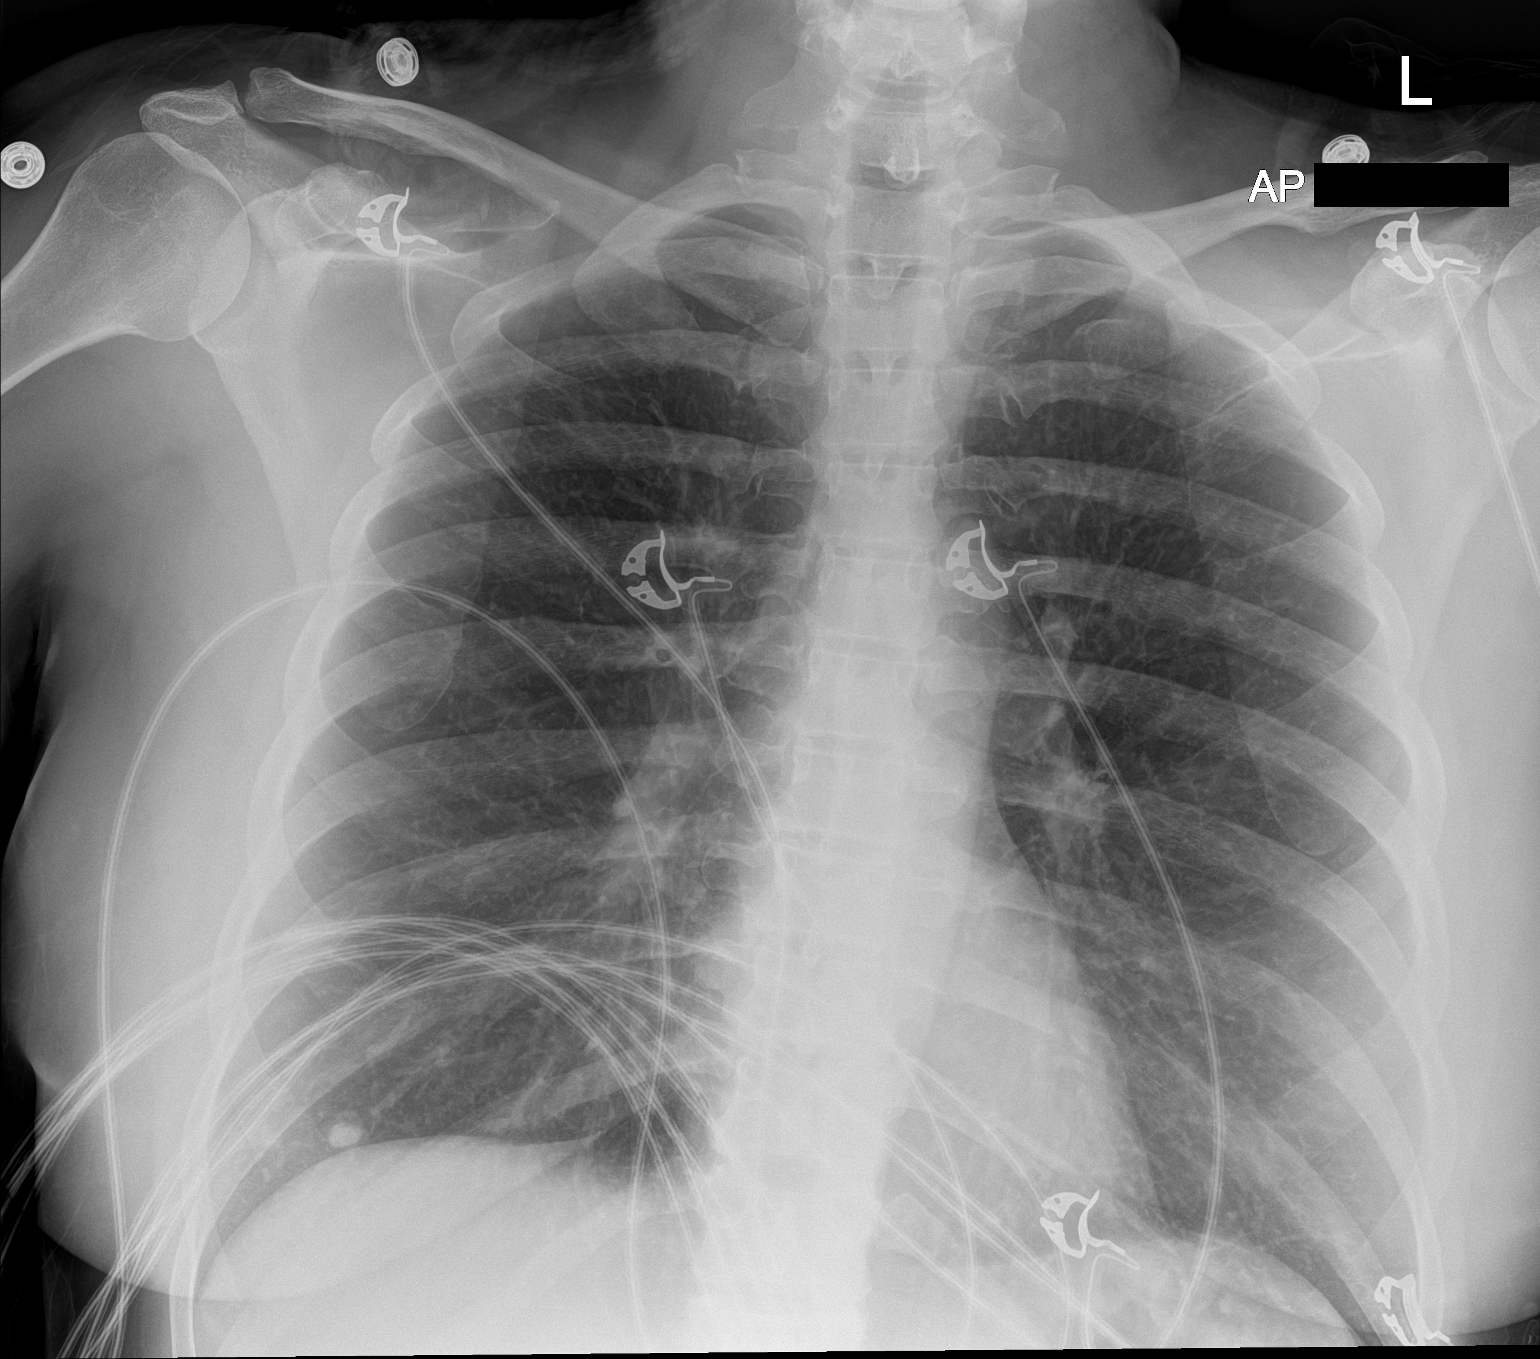

[chest lat (2 of 2)]
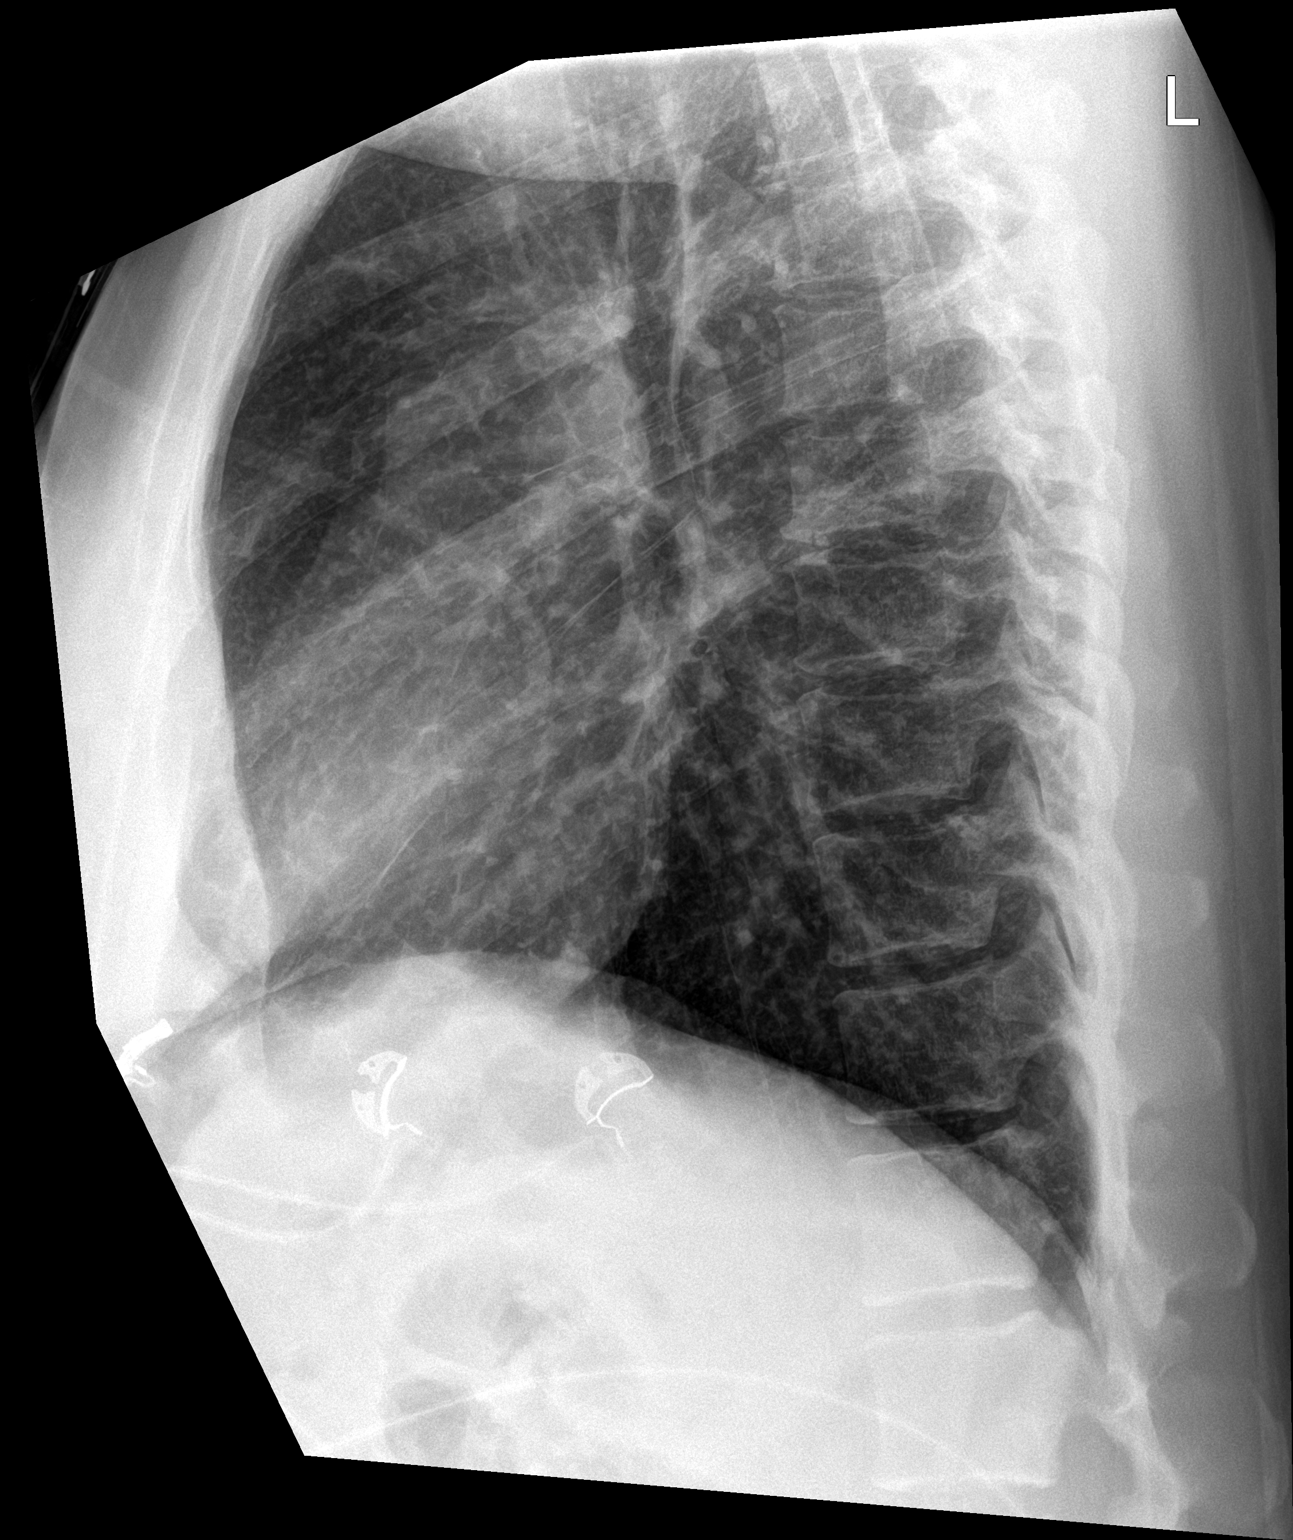

[3 of 3 positions shown; findings below may reference images not displayed]

FINDINGS: Calcified granuloma at the right lung base. Lungs otherwise clear.
Heart is normal size. No effusions or acute bony abnormality.
IMPRESSION: No active cardiopulmonary disease.
